# Patient Record
Sex: Male | Born: 1990 | Race: Black or African American | Hispanic: No | Marital: Single | Smoking: Current every day smoker
Health system: Southern US, Community
[De-identification: ages and names within clinical notes are randomized; demographics above are authoritative.]

## PROBLEM LIST (undated history)

## (undated) DIAGNOSIS — J069 Acute upper respiratory infection, unspecified: Secondary | ICD-10-CM

---
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
## 2013-04-02 ENCOUNTER — Encounter (HOSPITAL_BASED_OUTPATIENT_CLINIC_OR_DEPARTMENT_OTHER): Payer: Self-pay | Admitting: *Deleted

## 2013-04-02 ENCOUNTER — Emergency Department (HOSPITAL_BASED_OUTPATIENT_CLINIC_OR_DEPARTMENT_OTHER): Payer: Managed Care, Other (non HMO)

## 2013-04-02 ENCOUNTER — Emergency Department (HOSPITAL_BASED_OUTPATIENT_CLINIC_OR_DEPARTMENT_OTHER)
Admission: EM | Admit: 2013-04-02 | Discharge: 2013-04-02 | Disposition: A | Discharge status: Home / Self Care | Payer: Managed Care, Other (non HMO) | Attending: Emergency Medicine | Admitting: Emergency Medicine

## 2013-04-02 DIAGNOSIS — J069 Acute upper respiratory infection, unspecified: Secondary | ICD-10-CM | POA: Insufficient documentation

## 2013-04-02 DIAGNOSIS — R0982 Postnasal drip: Secondary | ICD-10-CM | POA: Insufficient documentation

## 2013-04-02 DIAGNOSIS — IMO0001 Reserved for inherently not codable concepts without codable children: Secondary | ICD-10-CM | POA: Insufficient documentation

## 2013-04-02 DIAGNOSIS — J3489 Other specified disorders of nose and nasal sinuses: Secondary | ICD-10-CM | POA: Insufficient documentation

## 2013-04-02 DIAGNOSIS — F17200 Nicotine dependence, unspecified, uncomplicated: Secondary | ICD-10-CM | POA: Insufficient documentation

## 2013-04-02 DIAGNOSIS — Z79899 Other long term (current) drug therapy: Secondary | ICD-10-CM | POA: Insufficient documentation

## 2013-04-02 DIAGNOSIS — R062 Wheezing: Secondary | ICD-10-CM | POA: Insufficient documentation

## 2013-04-02 MED ORDER — ALBUTEROL SULFATE HFA 108 (90 BASE) MCG/ACT IN AERS
2.0000 | INHALATION_SPRAY | Freq: Four times a day (QID) | RESPIRATORY_TRACT | Status: DC | PRN
  Administered 2013-04-02: 2 via RESPIRATORY_TRACT
  Filled 2013-04-02: qty 6.7

## 2013-04-02 MED ORDER — GUAIFENESIN-CODEINE 100-10 MG/5ML PO SYRP: 5.0000 mL | ORAL_SOLUTION | Freq: Three times a day (TID) | ORAL | Status: DC | PRN

## 2013-04-02 NOTE — Patient Instructions (Signed)
Instructed patient on the proper use of administering albuteral mdi via aerochamber patient tolerated well 

## 2013-04-02 NOTE — ED Provider Notes (Signed)
History     CSN: [id number]  Arrival date & time 04/02/13  1943   First MD Initiated Contact with Patient 04/02/13 2002      Chief Complaint  Patient presents with  . Cough    (Consider location/radiation/quality/duration/timing/severity/associated sxs/prior treatment) HPI Comments: Patient presents with a cough for the last 4 days. He states it started 4 days ago with runny nose nasal congestion and now she did move down into his chest. He's had a cough productive of white-yellow sputum. He denies any chest pain. He denies any shortness of breath. He had one episode of vomiting with it first started but denies any ongoing nausea vomiting or diarrhea. He denies a history of underlying lung disease. He does smoke cigarettes. He's been using Robitussin without relief  Patient is a 22 y.o. male presenting with cough.  Cough Associated symptoms: myalgias and rhinorrhea   Associated symptoms: no chest pain, no chills, no diaphoresis, no fever, no headaches, no rash and no shortness of breath     History reviewed. No pertinent past medical history.  History reviewed. No pertinent past surgical history.  History reviewed. No pertinent family history.  History  Substance Use Topics  . Smoking status: Current Every Day Smoker -- 0.50 packs/day    Types: Cigarettes  . Smokeless tobacco: Not on file  . Alcohol Use: No      Review of Systems  Constitutional: Negative for fever, chills, diaphoresis and fatigue.  HENT: Positive for congestion, rhinorrhea and postnasal drip. Negative for sneezing.   Eyes: Negative.   Respiratory: Positive for cough. Negative for chest tightness and shortness of breath.   Cardiovascular: Negative for chest pain and leg swelling.  Gastrointestinal: Negative for nausea, vomiting, abdominal pain, diarrhea and blood in stool.  Genitourinary: Negative for frequency, hematuria, flank pain and difficulty urinating.  Musculoskeletal: Positive for myalgias.  Negative for back pain and arthralgias.  Skin: Negative for rash.  Neurological: Negative for dizziness, speech difficulty, weakness, numbness and headaches.    Allergies  Review of patient's allergies indicates no known allergies.  Home Medications   Current Outpatient Rx  Name  Route  Sig  Dispense  Refill  . guaifenesin (ROBITUSSIN) 100 MG/5ML syrup   Oral   Take 200 mg by mouth 3 (three) times daily as needed for cough.         Marland Kitchen guaiFENesin-codeine (ROBITUSSIN AC) 100-10 MG/5ML syrup   Oral   Take 5 mLs by mouth 3 (three) times daily as needed for cough.   120 mL   0     BP 119/60  Pulse 63  Temp(Src) 98.9 F (37.2 C) (Oral)  Resp 16  Ht 5\' 6"  (1.676 m)  Wt 140 lb (63.504 kg)  BMI 22.61 kg/m2  SpO2 100%  Physical Exam  Constitutional: He is oriented to person, place, and time. He appears well-developed and well-nourished.  HENT:  Head: Normocephalic and atraumatic.  Mouth/Throat: Oropharynx is clear and moist.  Eyes: Pupils are equal, round, and reactive to light.  Neck: Normal range of motion. Neck supple.  Cardiovascular: Normal rate, regular rhythm and normal heart sounds.   Pulmonary/Chest: Effort normal. No respiratory distress. He has wheezes (. Mild scant expiratory wheezing). He has no rales. He exhibits no tenderness.  Abdominal: Soft. Bowel sounds are normal. There is no tenderness. There is no rebound and no guarding.  Musculoskeletal: Normal range of motion. He exhibits no edema and no tenderness.  Lymphadenopathy:    He has no cervical  adenopathy.  Neurological: He is alert and oriented to person, place, and time.  Skin: Skin is warm and dry. No rash noted.  Psychiatric: He has a normal mood and affect.    ED Course  Procedures (including critical care time)  Dg Chest 2 View  04/02/2013   *RADIOLOGY REPORT*  Clinical Data: Cough, fever.  CHEST - 2 VIEW  Comparison: None.  Findings: Heart and mediastinal contours are within normal limits. No  focal opacities or effusions.  No acute bony abnormality.  IMPRESSION: Negative.   Original Report Authenticated By: Charlett Nose, M.D.      1. URI (upper respiratory infection)       MDM  Patient with URI symptoms. His lungs are clear without evidence of pneumonia. He has some scant expiratory wheezing but no increased work of breathing or retractions. Given the fact he is a smoker I will go ahead and use an albuterol MDI. I gave him prescription for Robitussin-AC. I gave him referral to followup at the family practice group here. Advised to return if his symptoms worsen.        Rolan Bucco, MD 04/02/13 2034

## 2013-04-02 NOTE — ED Notes (Signed)
Pt reports cough and body aches x 4 days

## 2014-10-12 IMAGING — CR DG CHEST 2V
2 series · 2 of 2 positions shown · non-contrast
Comparison: None.

CLINICAL DATA: Cough, fever.

CHEST - 2 VIEW

[w chest pa]
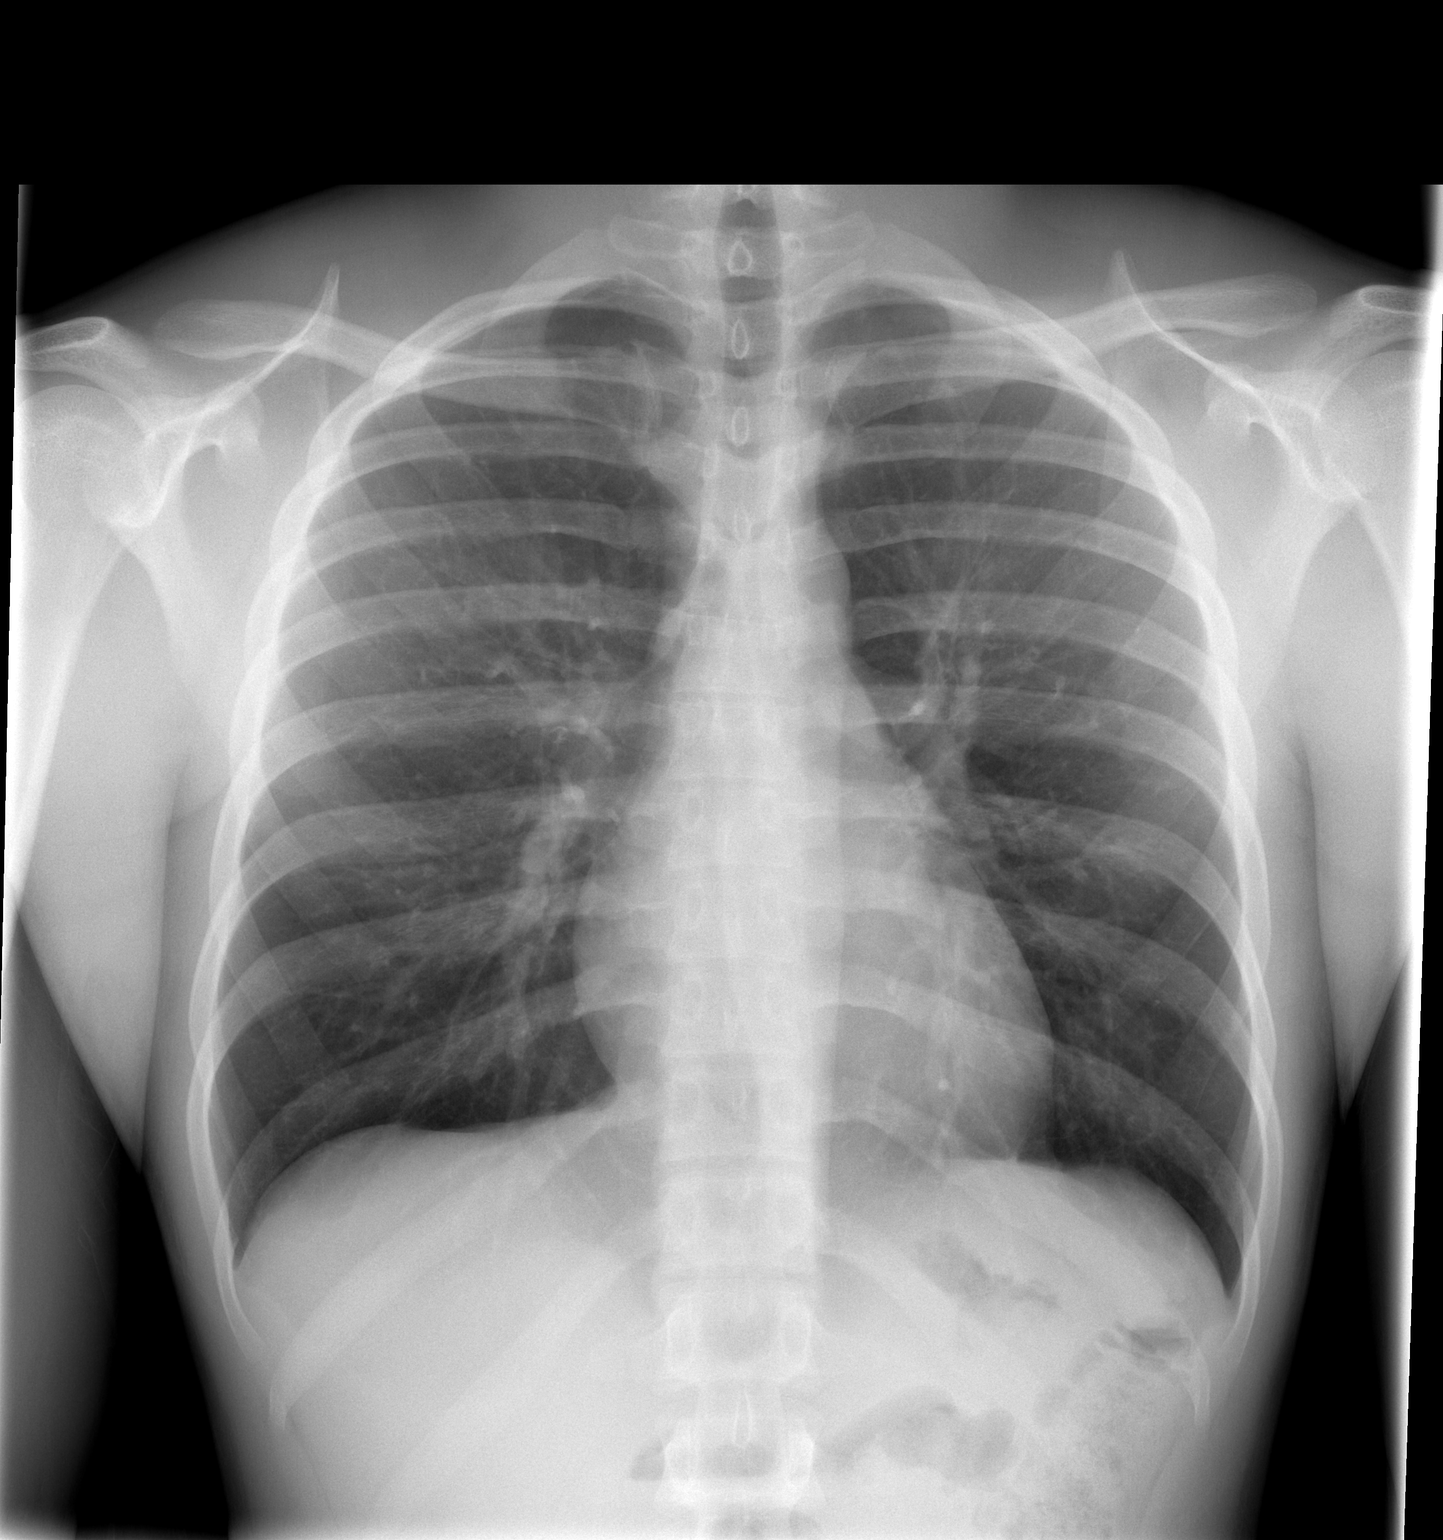

[w chest lat]
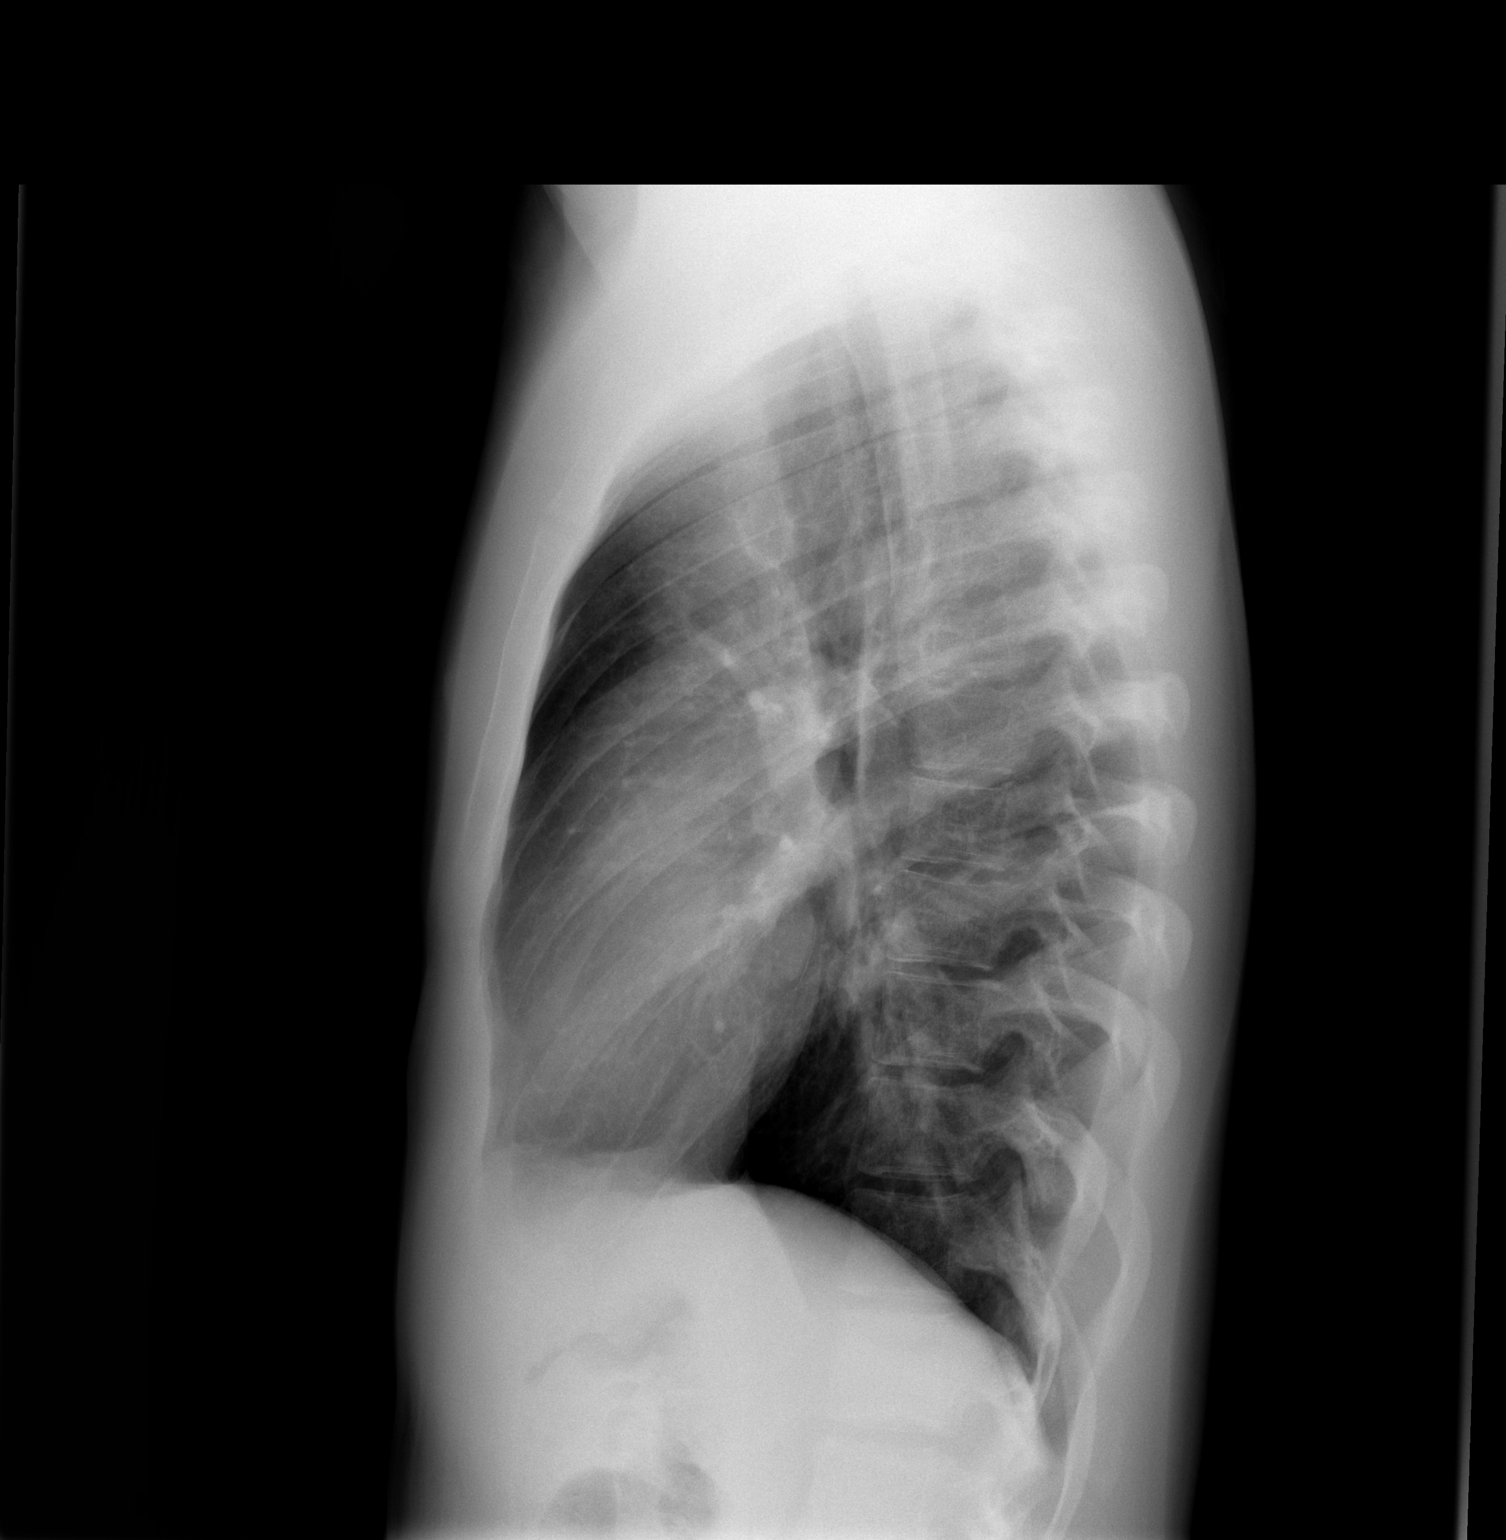

[2 of 2 positions shown; findings below may reference images not displayed]

FINDINGS: Heart and mediastinal contours are within normal limits.
No focal opacities or effusions.  No acute bony abnormality.
IMPRESSION: Negative.

## 2018-12-13 ENCOUNTER — Ambulatory Visit: Payer: Self-pay | Admitting: Surgery

## 2018-12-13 NOTE — H&P (Signed)
Bradley Norton Documented: 12/13/2018 3:47 PM Location: [facility] Patient #: [id number] DOB: [birth year] Single / Language: [place] / Race: Black or African American Male  History of Present Illness (Chelsea A. Fredricka Bonineonnor MD; 12/13/2018 4:09 PM) Patient words: Otherwise healthy 28 year old gentleman referred by his dermatologist for evaluation of a right inguinal lymph node. The patient does not know anything about this, he states that he has several skin issues including a lesion on his anterior right knee, and his right scrotal skin, and on his right medial buttock all of which are bothering him due to discomfort and worries about worsening infection.  The patient is a 28 year old male.   Past Surgical History Renee Ramus(Armen Ferguson, CMA; 12/13/2018 3:47 PM) No pertinent past surgical history  Diagnostic Studies History Renee Ramus(Armen Ferguson, CMA; 12/13/2018 3:47 PM) Colonoscopy never  Allergies Renee Ramus(Armen Ferguson, CMA; 12/13/2018 3:51 PM) No Known Allergies [12/13/2018]:  Medication History Renee Ramus(Armen Ferguson, CMA; 12/13/2018 3:52 PM) No Current Medications Medications Reconciled  Social History Renee Ramus(Armen Ferguson, CMA; 12/13/2018 3:47 PM) Alcohol use Moderate alcohol use. Illicit drug use Uses socially only. No caffeine use Tobacco use Current every day smoker.  Family History Renee Ramus(Armen Ferguson, CMA; 12/13/2018 3:47 PM) Alcohol Abuse Father. Arthritis Mother. Diabetes Mellitus Mother. Hypertension Father, Mother.  Other Problems Renee Ramus(Armen Ferguson, CMA; 12/13/2018 3:47 PM) No pertinent past medical history     Review of Systems (Armen Ferguson CMA; 12/13/2018 3:47 PM) General Not Present- Appetite Loss, Chills, Fatigue, Fever, Night Sweats, Weight Gain and Weight Loss. Skin Not Present- Change in Wart/Mole, Dryness, Hives, Jaundice, New Lesions, Non-Healing Wounds, Rash and Ulcer. HEENT Not Present- Earache, Hearing Loss, Hoarseness, Nose Bleed, Oral Ulcers, Ringing in the Ears,  Seasonal Allergies, Sinus Pain, Sore Throat, Visual Disturbances, Wears glasses/contact lenses and Yellow Eyes. Respiratory Not Present- Bloody sputum, Chronic Cough, Difficulty Breathing, Snoring and Wheezing. Breast Not Present- Breast Mass, Breast Pain, Nipple Discharge and Skin Changes. Gastrointestinal Not Present- Abdominal Pain, Bloating, Bloody Stool, Change in Bowel Habits, Chronic diarrhea, Constipation, Difficulty Swallowing, Excessive gas, Gets full quickly at meals, Hemorrhoids, Indigestion, Nausea, Rectal Pain and Vomiting. Male Genitourinary Not Present- Blood in Urine, Change in Urinary Stream, Frequency, Impotence, Nocturia, Painful Urination, Urgency and Urine Leakage. Musculoskeletal Not Present- Back Pain, Joint Pain, Joint Stiffness, Muscle Pain, Muscle Weakness and Swelling of Extremities. Neurological Not Present- Decreased Memory, Fainting, Headaches, Numbness, Seizures, Tingling, Tremor, Trouble walking and Weakness. Hematology Not Present- Blood Thinners, Easy Bruising, Excessive bleeding, Gland problems, HIV and Persistent Infections.  Vitals (Armen Ferguson CMA; 12/13/2018 3:50 PM) 12/13/2018 3:50 PM Weight: 132 lb Height: 66in Body Surface Area: 1.68 m Body Mass Index: 21.31 kg/m  Temp.: 98.34F  Pulse: 70 (Regular)  P.OX: 98% (Room air) BP: 110/80 (Sitting, Left Arm, Standard)      Physical Exam (Chelsea A. Fredricka Bonineonnor MD; 12/13/2018 4:11 PM)  The physical exam findings are as follows: Note:Gen: alert and well appearing Eye: extraocular motion intact, no scleral icterus ENT: moist mucus membranes, dentition intact Neck: no mass or thyromegaly Chest: unlabored respirations, symmetrical air entry, clear bilaterally CV: regular rate and rhythm, no pedal edema Abdomen: soft, nontender, nondistended. No mass or organomegaly MSK: strength symmetrical throughout, no deformity Neuro: grossly intact, normal gait Psych: normal mood and affect, appropriate  insight Skin: warm and dry. The lesion of concern to the patient on the right anterior knee is approximately 3 mm very superficial subcutaneous nodule likely residual scar/ inflammation. There is no palpable or visible skin lesion in the right groin  or scrotum and the patient is unable to locate the region had previously been worried about. In the right superior medial buttocks, there is an approximately 1 cm subcutaneous cyst without any overlying skin changes to suggest active infection  In the right groin there is a vaguely palpable superficial lymph node which is soft, mobile, and nontender, approximately 1 cm maximal diameter    Assessment & Plan (Chelsea A. Fredricka Bonine MD; 12/13/2018 4:14 PM)  PILONIDAL CYST (L05.91) Story: The lesion on his right knee does not require any intervention, he can follow up further with his dermatologist. The lymph node noted in the right groin is not especially concerning. Advised patient that if it increases in size significantly or if other LAD noted would recommend core biopsy. He does have a small subcuaneous cyst on the upper medial right buttock c/w pilonidal or sebaceous which is bothering him and he wishes excised. Discussed that this is outpatient procedure, risk of bleeding, infection, scarring, cyst recurrence, and high likelihood of open wound requiring packing changes by the patient daily for several weeks. He expresses understanding and wishes to proceed with excision.

## 2019-02-03 ENCOUNTER — Emergency Department (HOSPITAL_BASED_OUTPATIENT_CLINIC_OR_DEPARTMENT_OTHER)
Admission: EM | Admit: 2019-02-03 | Discharge: 2019-02-03 | Disposition: A | Discharge status: Home / Self Care | Payer: Managed Care, Other (non HMO) | Attending: Emergency Medicine | Admitting: Emergency Medicine

## 2019-02-03 ENCOUNTER — Encounter (HOSPITAL_BASED_OUTPATIENT_CLINIC_OR_DEPARTMENT_OTHER): Payer: Self-pay

## 2019-02-03 ENCOUNTER — Other Ambulatory Visit: Payer: Self-pay

## 2019-02-03 DIAGNOSIS — R599 Enlarged lymph nodes, unspecified: Secondary | ICD-10-CM

## 2019-02-03 DIAGNOSIS — L089 Local infection of the skin and subcutaneous tissue, unspecified: Secondary | ICD-10-CM | POA: Diagnosis not present

## 2019-02-03 DIAGNOSIS — F17210 Nicotine dependence, cigarettes, uncomplicated: Secondary | ICD-10-CM | POA: Insufficient documentation

## 2019-02-03 DIAGNOSIS — R222 Localized swelling, mass and lump, trunk: Secondary | ICD-10-CM | POA: Diagnosis present

## 2019-02-03 MED ORDER — CLINDAMYCIN HCL 150 MG PO CAPS: 300.0000 mg | ORAL_CAPSULE | Freq: Four times a day (QID) | ORAL | 0 refills | Status: DC

## 2019-02-03 NOTE — ED Provider Notes (Signed)
[facility] EMERGENCY DEPARTMENT Provider Note   CSN: [id number] Arrival date & time: 02/03/19  1610    History   Chief Complaint Chief Complaint  Patient presents with  . Swollen Lymph Node    HPI Bradley Norton is a 28 y.o. male.     Patient presents with 2-day history of tenderness in the left groin area with associated swelling.  Patient states that he has a history of "hair bumps" in the area and noted a swollen lymph node at onset.  No fevers, nausea, vomiting.  No urinary symptoms or penile discharge.  He thinks he has a small area of skin irritation.  He has been applying cold compresses with improvement in pain prior to arrival.     History reviewed. No pertinent past medical history.  There are no active problems to display for this patient.   History reviewed. No pertinent surgical history.      Home Medications    Prior to Admission medications   Medication Sig Start Date End Date Taking? Authorizing Provider  clindamycin (CLEOCIN) 150 MG capsule Take 2 capsules (300 mg total) by mouth every 6 (six) hours. 02/03/19   Renne Crigler, PA-C  guaifenesin (ROBITUSSIN) 100 MG/5ML syrup Take 200 mg by mouth 3 (three) times daily as needed for cough.    [provider]  guaiFENesin-codeine (ROBITUSSIN AC) 100-10 MG/5ML syrup Take 5 mLs by mouth 3 (three) times daily as needed for cough. 04/02/13   Rolan Bucco, MD    Family History No family history on file.  Social History Social History   Tobacco Use  . Smoking status: Current Every Day Smoker    Packs/day: 0.50    Types: Cigarettes  Substance Use Topics  . Alcohol use: No  . Drug use: Yes    Types: Marijuana     Allergies   Patient has no known allergies.   Review of Systems Review of Systems  Constitutional: Negative for fever.  Gastrointestinal: Negative for nausea and vomiting.  Skin: Positive for color change.  Hematological: Positive for adenopathy.     Physical  Exam Updated Vital Signs BP 120/80 (BP Location: Left Arm)   Pulse 87   Temp 98.2 F (36.8 C) (Oral)   Resp 12   Ht 5\' 8"  (1.727 m)   Wt 59 kg   SpO2 100%   BMI 19.77 kg/m   Physical Exam Vitals signs and nursing note reviewed.  Constitutional:      Appearance: He is well-developed.  HENT:     Head: Normocephalic and atraumatic.  Eyes:     Conjunctiva/sclera: Conjunctivae normal.  Neck:     Musculoskeletal: Normal range of motion and neck supple.  Pulmonary:     Effort: No respiratory distress.  Skin:    General: Skin is warm and dry.     Comments: There is a small area of irritation without fluctuance or induration medial to the left groin crease.  Within the groin crease there is a slightly enlarged lymph node without overlying warmth or erythema.  Patient also has several small normal nontender lymph nodes.  All are mobile.  Neurological:     Mental Status: He is alert.      ED Treatments / Results  Labs (all labs ordered are listed, but only abnormal results are displayed) Labs Reviewed - No data to display  EKG None  Radiology No results found.  Procedures Procedures (including critical care time)  Medications Ordered in ED Medications - No  data to display   Initial Impression / Assessment and Plan / ED Course  I have reviewed the triage vital signs and the nursing notes.  Pertinent labs & imaging results that were available during my care of the patient were reviewed by me and considered in my medical decision making (see chart for details).        Patient seen and examined.  Will give course of clindamycin.  Encouraged warm compresses.  Encouraged NSAIDs for pain control.  If the area enlarges or becomes more painful or if he develops systemic symptoms such as fever he should return for recheck.  He states that he plans to follow-up with his dermatologist.  Vital signs reviewed and are as follows: BP 120/80 (BP Location: Left Arm)   Pulse 87    Temp 98.2 F (36.8 C) (Oral)   Resp 12   Ht 5\' 8"  (1.727 m)   Wt 59 kg   SpO2 100%   BMI 19.77 kg/m     Final Clinical Impressions(s) / ED Diagnoses   Final diagnoses:  Skin infection  Enlarged lymph node   Patient with small area of skin irritation, mild infection likely closing secondary enlarged lymph node.  There is no abscess today which would require incision and drainage.  Treatment as above.  Return instructions as above.   ED Discharge Orders         Ordered    clindamycin (CLEOCIN) 150 MG capsule  Every 6 hours     02/03/19 1628           Renne Crigler, PA-C 02/03/19 1633    Rolan Bucco, MD 02/03/19 [phone]

## 2019-02-03 NOTE — Discharge Instructions (Signed)
Please read and follow all provided instructions.  Your diagnoses today include:  1. Skin infection   2. Enlarged lymph node     Tests performed today include:  Vital signs. See below for your results today.   Medications prescribed:   Clindamycin - antibiotic  You have been prescribed an antibiotic medicine: take the entire course of medicine even if you are feeling better. Stopping early can cause the antibiotic not to work.  Take any prescribed medications only as directed.   Home care instructions:   Follow any educational materials contained in this packet  Follow-up instructions: Return to the Emergency Department in 48 hours for a recheck if your symptoms are not significantly improved.  Return instructions:  Return to the Emergency Department if you have:  Fever  Worsening symptoms  Worsening pain  Worsening swelling  Redness of the skin that moves away from the affected area, especially if it streaks away from the affected area   Any other emergent concerns  Your vital signs today were: BP 120/80 (BP Location: Left Arm)    Pulse 87    Temp 98.2 F (36.8 C) (Oral)    Resp 12    Ht 5\' 8"  (1.727 m)    Wt 59 kg    SpO2 100%    BMI 19.77 kg/m  If your blood pressure (BP) was elevated above 135/85 this visit, please have this repeated by your doctor within one month. --------------

## 2019-02-03 NOTE — ED Triage Notes (Signed)
Pt c/o swollen lymph node since yesterday, denies recent illness, denies fevers, denies cough or URI

## 2019-02-04 ENCOUNTER — Telehealth (HOSPITAL_BASED_OUTPATIENT_CLINIC_OR_DEPARTMENT_OTHER): Payer: Self-pay | Admitting: Emergency Medicine

## 2019-07-31 ENCOUNTER — Ambulatory Visit: Payer: Self-pay | Admitting: Surgery

## 2019-07-31 NOTE — H&P (View-Only) (Signed)
[name] Documented: 07/31/2019 8:50 AM Location: [facility] Patient #: [id number] DOB: [birth year] Single / Language: Bradley Norton / Race: Black or African American Male  History of Present Illness (Bradley Junio A. Kae Heller MD; 07/31/2019 9:06 AM) Patient words: Otherwise healthy 28 year old gentleman who I met in January of this year and had planned excision of a pilonidal/gluteal cyst. 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 No other changes in his health.  The patient is a 28 year old male.   Allergies [name], [name]; 07/31/2019 8:51 AM) No Known Allergies [12/13/2018]: Allergies Reconciled  Medication History [name], [place]; 07/31/2019 8:51 AM) No Current Medications Medications Reconciled    Vitals [name] CMA; 07/31/2019 8:51 AM) 07/31/2019 8:50 AM Weight: 137.6 lb Height: 66in Body Surface Area: 1.71 m Body Mass Index: 22.21 kg/m  Temp.: 20F  Pulse: 86 (Regular)  BP: 112/74 (Sitting, Left Arm, Standard)        Physical Exam ([name] A. Kae Heller MD; 07/31/2019 9:07 AM)  The physical exam findings are as follows: Note:Alert and well-appearing. Extraocular motions intact, no scleral icterus Unlabored respirations. Skin is warm and dry, to the right of midline a few centimeters below the top of the anal cleft there is a vaguely palpable 1 cm subcutaneous cyst. No tenderness or fluctuance, no overlying skin changes.    Assessment & Plan ([name] A. Kae Heller MD; 07/31/2019 9:08 AM)  PILONIDAL CYST (L05.91) Story: Continues to complain of a small subcutaneous cyst on the upper medial right buttock c/w pilonidal or sebaceous which is increasingly bothering him and he wishes excised. Discussed that this is outpatient procedure, risk of bleeding,  infection, scarring, cyst recurrence, and high likelihood of open wound requiring packing changes by the patient daily for several weeks. He expresses understanding and wishes to proceed with excision. We will go ahead and try to schedule him this month

## 2019-07-31 NOTE — H&P (Signed)
Bradley Norton Documented: 07/31/2019 8:50 AM Location: [facility] Patient #: [id number] DOB: [birth year] Single / Language: Bradley Norton / Race: Black or African American Male  History of Present Illness (Bradley Bartley A. Kae Heller MD; 07/31/2019 9:06 AM) Patient words: Otherwise healthy 29 year old gentleman who I met in January of this year and had planned excision of a pilonidal/gluteal cyst. 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 No other changes in his health.  The patient is a 28 year old male.   Allergies Bradley Norton, [name]; 07/31/2019 8:51 AM) No Known Allergies [12/13/2018]: Allergies Reconciled  Medication History Bradley Norton, [name]; 07/31/2019 8:51 AM) No Current Medications Medications Reconciled    Vitals Bradley Norton CMA; 07/31/2019 8:51 AM) 07/31/2019 8:50 AM Weight: 137.6 lb Height: 66in Body Surface Area: 1.71 m Body Mass Index: 22.21 kg/m  Temp.: 32F  Pulse: 86 (Regular)  BP: 112/74 (Sitting, Left Arm, Standard)        Physical Exam (Bradley Norton A. Kae Heller MD; 07/31/2019 9:07 AM)  The physical exam findings are as follows: Note:Alert and well-appearing. Extraocular motions intact, no scleral icterus Unlabored respirations. Skin is warm and dry, to the right of midline a few centimeters below the top of the anal cleft there is a vaguely palpable 1 cm subcutaneous cyst. No tenderness or fluctuance, no overlying skin changes.    Assessment & Plan (Bradley Handy A. Kae Heller MD; 07/31/2019 9:08 AM)  PILONIDAL CYST (L05.91) Story: Continues to complain of a small subcutaneous cyst on the upper medial right buttock c/w pilonidal or sebaceous which is increasingly bothering him and he wishes excised. Discussed that this is outpatient procedure, risk of bleeding,  infection, scarring, cyst recurrence, and high likelihood of open wound requiring packing changes by the patient daily for several weeks. He expresses understanding and wishes to proceed with excision. We will go ahead and try to schedule him this month

## 2019-08-12 NOTE — Progress Notes (Signed)
[id number] - [place], [place] - [address] [place] [address] Phone: [phone] Fax: [phone]      Your procedure is scheduled on September 28th.  Report to [facility] Main Entrance "A" at 5:30 A.M., and check in at the Admitting office.  Call this number if you have problems the morning of surgery:  [phone]  Call [phone] if you have any questions prior to your surgery date Monday-Friday 8am-4pm    Remember:  Do not eat or drink after midnight the night before your surgery    Take these medicines the morning of surgery with A SIP OF WATER - NONE  7 days prior to surgery STOP taking any Aspirin (unless otherwise instructed by your surgeon), Aleve, Naproxen, Ibuprofen, Motrin, Advil, Goody's, BC's, all herbal medications, fish oil, and all vitamins.    The Morning of Surgery  Do not wear jewelry.  Do not wear lotions, powders, colognes, or deodorant  Men may shave face and neck.  Do not bring valuables to the hospital.  [facility] is not responsible for any belongings or valuables.  If you are a smoker, DO NOT Smoke 24 hours prior to surgery IF you wear a CPAP at night please bring your mask, tubing, and machine the morning of surgery   Remember that you must have someone to transport you home after your surgery, and remain with you for 24 hours if you are discharged the same day.   Contacts, glasses, hearing aids, dentures or bridgework may not be worn into surgery.    Leave your suitcase in the car.  After surgery it may be brought to your room.  For patients admitted to the hospital, discharge time will be determined by your treatment team.  Patients discharged the day of surgery will not be allowed to drive home.    Special instructions:   [facility]- Preparing For Surgery  Before surgery, you can play an important role. Because skin is not sterile, your skin  needs to be as free of germs as possible. You can reduce the number of germs on your skin by washing with CHG (chlorahexidine gluconate) Soap before surgery.  CHG is an antiseptic cleaner which kills germs and bonds with the skin to continue killing germs even after washing.    Oral Hygiene is also important to reduce your risk of infection.  Remember - BRUSH YOUR TEETH THE MORNING OF SURGERY WITH YOUR REGULAR TOOTHPASTE  Please do not use if you have an allergy to CHG or antibacterial soaps. If your skin becomes reddened/irritated stop using the CHG.  Do not shave (including legs and underarms) for at least 48 hours prior to first CHG shower. It is OK to shave your face.  Please follow these instructions carefully.   1. Shower the NIGHT BEFORE SURGERY and the MORNING OF SURGERY with CHG Soap.   2. If you chose to wash your hair, wash your hair first as usual with your normal shampoo.  3. After you shampoo, rinse your hair and body thoroughly to remove the shampoo.  4. Use CHG as you would any other liquid soap. You can apply CHG directly to the skin and wash gently with a scrungie or a clean washcloth.   5. Apply the CHG Soap to your body ONLY FROM THE NECK DOWN.  Do not use on open wounds or open sores. Avoid contact with your eyes,  ears, mouth and genitals (private parts). Wash Face and genitals (private parts)  with your normal soap.   6. Wash thoroughly, paying special attention to the area where your surgery will be performed.  7. Thoroughly rinse your body with warm water from the neck down.  8. DO NOT shower/wash with your normal soap after using and rinsing off the CHG Soap.  9. Pat yourself dry with a CLEAN TOWEL.  10. Wear CLEAN PAJAMAS to bed the night before surgery, wear comfortable clothes the morning of surgery  11. Place CLEAN SHEETS on your bed the night of your first shower and DO NOT SLEEP WITH PETS.    Day of Surgery:  Do not apply any  deodorants/lotions. Please shower the morning of surgery with the CHG soap  Please wear clean clothes to the hospital/surgery center.   Remember to brush your teeth WITH YOUR REGULAR TOOTHPASTE.   Please read over the following fact sheets that you were given.

## 2019-08-13 ENCOUNTER — Other Ambulatory Visit: Payer: Self-pay

## 2019-08-13 ENCOUNTER — Encounter (HOSPITAL_COMMUNITY): Payer: Self-pay

## 2019-08-13 ENCOUNTER — Encounter (HOSPITAL_COMMUNITY)
Admission: RE | Admit: 2019-08-13 | Discharge: 2019-08-13 | Disposition: A | Discharge status: Home / Self Care | Payer: Managed Care, Other (non HMO) | Source: Ambulatory Visit | Attending: Surgery | Admitting: Surgery

## 2019-08-13 DIAGNOSIS — Z01812 Encounter for preprocedural laboratory examination: Secondary | ICD-10-CM | POA: Diagnosis present

## 2019-08-13 HISTORY — DX: Acute upper respiratory infection, unspecified: J06.9

## 2019-08-13 LAB — CBC
HCT: 41.7 % (ref 39.0–52.0)
Hemoglobin: 14.3 g/dL (ref 13.0–17.0)
MCH: 32.2 pg (ref 26.0–34.0)
MCHC: 34.3 g/dL (ref 30.0–36.0)
MCV: 93.9 fL (ref 80.0–100.0)
Platelets: 192 10*3/uL (ref 150–400)
RBC: 4.44 MIL/uL (ref 4.22–5.81)
RDW: 12.8 % (ref 11.5–15.5)
WBC: 5.8 10*3/uL (ref 4.0–10.5)
nRBC: 0 % (ref 0.0–0.2)

## 2019-08-13 LAB — COMPREHENSIVE METABOLIC PANEL
ALT: 14 U/L (ref 0–44)
AST: 18 U/L (ref 15–41)
Albumin: 4.1 g/dL (ref 3.5–5.0)
Alkaline Phosphatase: 72 U/L (ref 38–126)
Anion gap: 8 (ref 5–15)
BUN: 9 mg/dL (ref 6–20)
CO2: 25 mmol/L (ref 22–32)
Calcium: 9.3 mg/dL (ref 8.9–10.3)
Chloride: 104 mmol/L (ref 98–111)
Creatinine, Ser: 1.01 mg/dL (ref 0.61–1.24)
GFR calc Af Amer: >60 mL/min (ref >60)
GFR calc non Af Amer: >60 mL/min (ref >60)
Glucose, Bld: 90 mg/dL (ref 70–99)
Potassium: 4.2 mmol/L (ref 3.5–5.1)
Sodium: 137 mmol/L (ref 135–145)
Total Bilirubin: 0.3 mg/dL (ref 0.3–1.2)
Total Protein: 6.4 g/dL — ABNORMAL LOW (ref 6.5–8.1)

## 2019-08-13 NOTE — Progress Notes (Signed)
PCP - na Cardiologist - na  PPM/ICD - na Device Orders - na Rep Notified  Chest x-ray - na EKG - na Stress Test - na ECHO - na Cardiac Cath - na  Sleep Study - na CPAP -   Fasting Blood Sugar - na Checks Blood Sugar _____ times a day  Blood Thinner Instructions: na Aspirin Instructions: na  ERAS Protcol -na PRE-SURGERY Ensure - na  COVID TEST- 9/24   Anesthesia review:   Patient denies shortness of breath, fever, cough and chest pain at PAT appointment   Patient verbalized understanding of instructions that were given to them at the PAT appointment. Patient was also instructed that they will need to review over the PAT instructions again at home before surgery.

## 2019-08-14 ENCOUNTER — Other Ambulatory Visit (HOSPITAL_COMMUNITY)
Admission: RE | Admit: 2019-08-14 | Discharge: 2019-08-14 | Disposition: A | Discharge status: Home / Self Care | Payer: Managed Care, Other (non HMO) | Source: Ambulatory Visit | Attending: Surgery | Admitting: Surgery

## 2019-08-14 DIAGNOSIS — Z01812 Encounter for preprocedural laboratory examination: Secondary | ICD-10-CM | POA: Insufficient documentation

## 2019-08-14 DIAGNOSIS — Z20828 Contact with and (suspected) exposure to other viral communicable diseases: Secondary | ICD-10-CM | POA: Insufficient documentation

## 2019-08-15 LAB — NOVEL CORONAVIRUS, NAA (HOSP ORDER, SEND-OUT TO REF LAB; TAT 18-24 HRS): SARS-CoV-2, NAA: NOT DETECTED

## 2019-08-17 NOTE — Anesthesia Preprocedure Evaluation (Addendum)
Anesthesia Evaluation  Patient identified by MRN, date of birth, ID band Patient awake    Reviewed: Allergy & Precautions, NPO status , Patient's Chart, lab work & pertinent test results  Airway Mallampati: I  TM Distance: >3 FB Neck ROM: Full    Dental no notable dental hx. (+) Teeth Intact, Dental Advisory Given   Pulmonary neg pulmonary ROS, Current Smoker and Patient abstained from smoking.,    Pulmonary exam normal breath sounds clear to auscultation       Cardiovascular negative cardio ROS Normal cardiovascular exam Rhythm:Regular Rate:Normal     Neuro/Psych negative neurological ROS  negative psych ROS   GI/Hepatic negative GI ROS, Neg liver ROS,   Endo/Other  negative endocrine ROS  Renal/GU negative Renal ROS  negative genitourinary   Musculoskeletal negative musculoskeletal ROS (+)   Abdominal   Peds  Hematology negative hematology ROS (+)   Anesthesia Other Findings   Reproductive/Obstetrics                           Anesthesia Physical Anesthesia Plan  ASA: II  Anesthesia Plan: MAC   Post-op Pain Management:    Induction: Intravenous  PONV Risk Score and Plan: 0 and Propofol infusion and Treatment may vary due to age or medical condition  Airway Management Planned: Natural Airway  Additional Equipment:   Intra-op Plan:   Post-operative Plan:   Informed Consent: I have reviewed the patients History and Physical, chart, labs and discussed the procedure including the risks, benefits and alternatives for the proposed anesthesia with the patient or authorized representative who has indicated his/her understanding and acceptance.     Dental advisory given  Plan Discussed with: CRNA  Anesthesia Plan Comments:         Anesthesia Quick Evaluation

## 2019-08-18 ENCOUNTER — Other Ambulatory Visit: Payer: Self-pay

## 2019-08-18 ENCOUNTER — Ambulatory Visit (HOSPITAL_COMMUNITY): Payer: Managed Care, Other (non HMO)

## 2019-08-18 ENCOUNTER — Ambulatory Visit (HOSPITAL_COMMUNITY)
Admission: RE | Admit: 2019-08-18 | Discharge: 2019-08-18 | Disposition: A | Discharge status: Home / Self Care | Payer: Managed Care, Other (non HMO) | Attending: Surgery | Admitting: Surgery

## 2019-08-18 ENCOUNTER — Encounter (HOSPITAL_COMMUNITY)
Admission: RE | Disposition: A | Discharge status: Home / Self Care | Payer: Self-pay | Source: Home / Self Care | Attending: Surgery

## 2019-08-18 ENCOUNTER — Encounter (HOSPITAL_COMMUNITY): Payer: Self-pay | Admitting: General Practice

## 2019-08-18 DIAGNOSIS — L728 Other follicular cysts of the skin and subcutaneous tissue: Secondary | ICD-10-CM | POA: Insufficient documentation

## 2019-08-18 DIAGNOSIS — F17200 Nicotine dependence, unspecified, uncomplicated: Secondary | ICD-10-CM | POA: Insufficient documentation

## 2019-08-18 HISTORY — PX: MASS EXCISION: SHX2000

## 2019-08-18 SURGERY — EXCISION MASS
Anesthesia: Monitor Anesthesia Care | Site: Buttocks

## 2019-08-18 MED ORDER — TRAMADOL HCL 50 MG PO TABS: 50.0000 mg | ORAL_TABLET | Freq: Four times a day (QID) | ORAL | 0 refills | Status: AC | PRN

## 2019-08-18 MED ORDER — LIDOCAINE HCL (CARDIAC) PF 100 MG/5ML IV SOSY
PREFILLED_SYRINGE | INTRAVENOUS | Status: DC | PRN
  Administered 2019-08-18: 40 mg via INTRAVENOUS

## 2019-08-18 MED ORDER — CEFAZOLIN SODIUM-DEXTROSE 2-4 GM/100ML-% IV SOLN
2.0000 g | INTRAVENOUS | Status: AC
  Administered 2019-08-18: 08:00:00 2 g via INTRAVENOUS
  Filled 2019-08-18: qty 100

## 2019-08-18 MED ORDER — 0.9 % SODIUM CHLORIDE (POUR BTL) OPTIME
TOPICAL | Status: DC | PRN
  Administered 2019-08-18: 08:00:00 1000 mL

## 2019-08-18 MED ORDER — ACETAMINOPHEN 500 MG PO TABS
1000.0000 mg | ORAL_TABLET | ORAL | Status: DC
  Filled 2019-08-18: qty 2

## 2019-08-18 MED ORDER — GABAPENTIN 300 MG PO CAPS
300.0000 mg | ORAL_CAPSULE | ORAL | Status: DC
  Filled 2019-08-18: qty 1

## 2019-08-18 MED ORDER — BUPIVACAINE HCL (PF) 0.25 % IJ SOLN
INTRAMUSCULAR | Status: AC
  Filled 2019-08-18: qty 30

## 2019-08-18 MED ORDER — CHLORHEXIDINE GLUCONATE 4 % EX LIQD: 60.0000 mL | Freq: Once | CUTANEOUS | Status: DC

## 2019-08-18 MED ORDER — KETAMINE HCL 10 MG/ML IJ SOLN
INTRAMUSCULAR | Status: DC | PRN
  Administered 2019-08-18: 20 mg via INTRAVENOUS
  Administered 2019-08-18: 10 mg via INTRAVENOUS

## 2019-08-18 MED ORDER — PROPOFOL 10 MG/ML IV BOLUS
INTRAVENOUS | Status: DC | PRN
  Administered 2019-08-18 (×3): 20 mg via INTRAVENOUS
  Administered 2019-08-18: 30 mg via INTRAVENOUS

## 2019-08-18 MED ORDER — MIDAZOLAM HCL 2 MG/2ML IJ SOLN
INTRAMUSCULAR | Status: AC
  Filled 2019-08-18: qty 2

## 2019-08-18 MED ORDER — PROPOFOL 10 MG/ML IV BOLUS
INTRAVENOUS | Status: AC
  Filled 2019-08-18: qty 20

## 2019-08-18 MED ORDER — BUPIVACAINE-EPINEPHRINE (PF) 0.5% -1:200000 IJ SOLN
INTRAMUSCULAR | Status: AC
  Filled 2019-08-18: qty 30

## 2019-08-18 MED ORDER — PROPOFOL 1000 MG/100ML IV EMUL
INTRAVENOUS | Status: AC
  Filled 2019-08-18: qty 100

## 2019-08-18 MED ORDER — LIDOCAINE 2% (20 MG/ML) 5 ML SYRINGE
INTRAMUSCULAR | Status: AC
  Filled 2019-08-18: qty 5

## 2019-08-18 MED ORDER — PROPOFOL 500 MG/50ML IV EMUL
INTRAVENOUS | Status: DC | PRN
  Administered 2019-08-18: 150 ug/kg/min via INTRAVENOUS

## 2019-08-18 MED ORDER — BUPIVACAINE-EPINEPHRINE 0.25% -1:200000 IJ SOLN
INTRAMUSCULAR | Status: DC | PRN
  Administered 2019-08-18: 17 mL

## 2019-08-18 MED ORDER — LACTATED RINGERS IV SOLN
INTRAVENOUS | Status: DC | PRN
  Administered 2019-08-18: 08:00:00 via INTRAVENOUS

## 2019-08-18 MED ORDER — MIDAZOLAM HCL 2 MG/2ML IJ SOLN
INTRAMUSCULAR | Status: DC | PRN
  Administered 2019-08-18: 2 mg via INTRAVENOUS

## 2019-08-18 MED ORDER — KETAMINE HCL 50 MG/5ML IJ SOSY
PREFILLED_SYRINGE | INTRAMUSCULAR | Status: AC
  Filled 2019-08-18: qty 5

## 2019-08-18 SURGICAL SUPPLY — 29 items
CANISTER SUCT 3000ML PPV (MISCELLANEOUS) IMPLANT
COVER SURGICAL LIGHT HANDLE (MISCELLANEOUS) ×3 IMPLANT
COVER WAND RF STERILE (DRAPES) ×3 IMPLANT
DECANTER SPIKE VIAL GLASS SM (MISCELLANEOUS) ×3 IMPLANT
DERMABOND ADVANCED (GAUZE/BANDAGES/DRESSINGS) ×2
DERMABOND ADVANCED .7 DNX12 (GAUZE/BANDAGES/DRESSINGS) ×1 IMPLANT
DRAPE LAPAROSCOPIC ABDOMINAL (DRAPES) IMPLANT
DRAPE LAPAROTOMY 100X72 PEDS (DRAPES) ×3 IMPLANT
ELECT REM PT RETURN 9FT ADLT (ELECTROSURGICAL) ×3
ELECTRODE REM PT RTRN 9FT ADLT (ELECTROSURGICAL) ×1 IMPLANT
GAUZE SPONGE 4X4 12PLY STRL (GAUZE/BANDAGES/DRESSINGS) IMPLANT
GLOVE BIO SURGEON STRL SZ 6 (GLOVE) ×3 IMPLANT
GLOVE INDICATOR 6.5 STRL GRN (GLOVE) ×3 IMPLANT
GOWN STRL REUS W/ TWL LRG LVL3 (GOWN DISPOSABLE) ×1 IMPLANT
GOWN STRL REUS W/TWL LRG LVL3 (GOWN DISPOSABLE) ×2
KIT BASIN OR (CUSTOM PROCEDURE TRAY) ×3 IMPLANT
KIT TURNOVER KIT B (KITS) ×3 IMPLANT
NEEDLE HYPO 25GX1X1/2 BEV (NEEDLE) ×3 IMPLANT
NS IRRIG 1000ML POUR BTL (IV SOLUTION) ×3 IMPLANT
PACK GENERAL/GYN (CUSTOM PROCEDURE TRAY) ×3 IMPLANT
PAD ARMBOARD 7.5X6 YLW CONV (MISCELLANEOUS) ×3 IMPLANT
PENCIL SMOKE EVACUATOR (MISCELLANEOUS) ×3 IMPLANT
SPECIMEN JAR MEDIUM (MISCELLANEOUS) ×3 IMPLANT
SUT MNCRL AB 4-0 PS2 18 (SUTURE) ×3 IMPLANT
SUT VIC AB 3-0 SH 27 (SUTURE) ×2
SUT VIC AB 3-0 SH 27XBRD (SUTURE) ×1 IMPLANT
SYR CONTROL 10ML LL (SYRINGE) ×3 IMPLANT
TOWEL GREEN STERILE (TOWEL DISPOSABLE) ×3 IMPLANT
TOWEL GREEN STERILE FF (TOWEL DISPOSABLE) ×3 IMPLANT

## 2019-08-18 NOTE — Discharge Instructions (Signed)
GENERAL SURGERY: POST OP INSTRUCTIONS  ######################################################################  EAT Gradually transition to a high fiber diet with a fiber supplement over the next few weeks after discharge.  Start with a pureed / full liquid diet (see below)  WALK Walk an hour a day.  Control your pain to do that.    CONTROL PAIN Control pain so that you can walk, sleep, tolerate sneezing/coughing, go up/down stairs.  HAVE A BOWEL MOVEMENT DAILY Keep your bowels regular to avoid problems.  OK to try a laxative to override constipation.  OK to use an antidairrheal to slow down diarrhea.  Call if not better after 2 tries  CALL IF YOU HAVE PROBLEMS/CONCERNS Call if you are still struggling despite following these instructions. Call if you have concerns not answered by these instructions  ######################################################################    1. DIET: Follow a light bland diet & liquids the first 24 hours after arrival home, such as soup, liquids, starches, etc.  Be sure to drink plenty of fluids.  Quickly advance to a usual solid diet within a few days.  Avoid fast food or heavy meals as your are more likely to get nauseated or have irregular bowels.  A low-fat, high-fiber diet for the rest of your life is ideal.   2. Take your usually prescribed home medications unless otherwise directed. 3. PAIN CONTROL: a. Pain is best controlled by a usual combination of three different methods TOGETHER: i. Ice/Heat ii. Over the counter pain medication iii. Prescription pain medication b. Most patients will experience some swelling and bruising around the incisions.  Ice packs or heating pads (30-60 minutes up to 6 times a day) will help. Use ice for the first few days to help decrease swelling and bruising, then switch to heat to help relax tight/sore spots and speed recovery.  Some people prefer to use ice alone, heat alone, alternating between ice & heat.   Experiment to what works for you.  Swelling and bruising can take several weeks to resolve.   c. It is helpful to take an over-the-counter pain medication regularly for the first few weeks.  Choose one of the following that works best for you: i. Naproxen (Aleve, etc)  Two 220mg  tabs twice a day OR Ibuprofen (Advil, etc) Three 200mg  tabs four times a day (every meal & bedtime)  AND ii. Acetaminophen (Tylenol, etc) 500-650mg  four times a day (every meal & bedtime) d. A  prescription for pain medication should be given to you upon discharge.  Take your pain medication as prescribed, if needed.  i. If you are having problems/concerns with the prescription medicine (does not control pain, nausea, vomiting, rash, itching, etc), please call [phone] to see if we need to switch you to a different pain medicine that will work better for you and/or control your side effect better. ii. If you need a refill on your pain medication, please contact your pharmacy.  They will contact our office to request authorization. Prescriptions will not be filled after 5 pm or on week-ends. 4. Avoid getting constipated.  Between the surgery and the pain medications, it is common to experience some constipation.  Increasing fluid intake and taking a fiber supplement (such as Metamucil, Citrucel, FiberCon, MiraLax, etc) 1-2 times a day regularly will usually help prevent this problem from occurring.  A mild laxative (prune juice, Milk of Magnesia, MiraLax, etc) should be taken according to package directions if there are no bowel movements after 48 hours.   5. Wash / shower  every day, starting 2 days after surgery.  No rubbing, scrubbing, lotions or ointments to incision. 6. Remove your outer bandage 2 days after surgery.  You may leave the incision open to air.  You may have skin tapes (Steri Strips) covering the incision(s).  Leave them on until one week, then remove.  You may replace a dressing/Band-Aid to cover the  incision for comfort if you wish.   7. ACTIVITIES as tolerated:   a. You may resume regular (light) daily activities beginning the next day--such as daily self-care, walking, climbing stairs--gradually increasing activities as tolerated.  If you can walk 30 minutes without difficulty, it is safe to try more intense activity such as jogging, treadmill, bicycling, low-impact aerobics, swimming, etc. b. Save the most intensive and strenuous activity for last such as sit-ups, heavy lifting, contact sports, etc  Refrain from any heavy lifting or straining until you are off narcotics for pain control.   c. DO NOT PUSH THROUGH PAIN.  Let pain be your guide: If it hurts to do something, don't do it.  Pain is your body warning you to avoid that activity for another week until the pain goes down. d. You may drive when you are no longer taking prescription pain medication, you can comfortably wear a seatbelt, and you can safely maneuver your car and apply brakes. e. Dennis Bast may have sexual intercourse when it is comfortable.  8. FOLLOW UP in our office a. Please call [facility] at [phone] to set up an appointment to see your surgeon in the office for a follow-up appointment approximately 2-3 weeks after your surgery. b. Make sure that you call for this appointment the day you arrive home to insure a convenient appointment time. 9. IF YOU HAVE DISABILITY OR FAMILY LEAVE FORMS, BRING THEM TO THE OFFICE FOR PROCESSING.  DO NOT GIVE THEM TO YOUR DOCTOR.   WHEN TO CALL us [phone]: 1. Poor pain control 2. Reactions / problems with new medications (rash/itching, nausea, etc)  3. Fever over 101.5 F (38.5 C) 4. Worsening swelling or bruising 5. Continued bleeding from incision. 6. Increased pain, redness, or drainage from the incision 7. Difficulty breathing / swallowing   The clinic staff is available to answer your questions during regular business hours (8:30am-5pm).  Please dont hesitate to call and ask  to speak to one of our nurses for clinical concerns.   If you have a medical emergency, go to the nearest emergency room or call 911.  A surgeon from [facility] is always on call at the [facility], [place], [place], [place], [place]  [id number] ? MAIN: [phone] ? TOLL FREE: [phone] ?  FAX [phone] [email or web address]

## 2019-08-18 NOTE — Anesthesia Postprocedure Evaluation (Signed)
Anesthesia Post Note  Patient: Bradley Norton  Procedure(s) Performed: EXCISION OF SUBCUTANEOUS CYST (N/A Buttocks)     Patient location during evaluation: PACU Anesthesia Type: MAC Level of consciousness: awake and alert Pain management: pain level controlled Vital Signs Assessment: post-procedure vital signs reviewed and stable Respiratory status: spontaneous breathing, nonlabored ventilation, respiratory function stable and patient connected to nasal cannula oxygen Cardiovascular status: stable and blood pressure returned to baseline Postop Assessment: no apparent nausea or vomiting Anesthetic complications: no    Last Vitals:  Vitals:   08/18/19 0816 08/18/19 0818  BP:    Pulse:  82  Resp:  (!) 21  Temp: 36.4 C   SpO2:  99%    Last Pain:  Vitals:   08/18/19 0816  PainSc: 0-No pain                 [name] L Lorrie Strauch

## 2019-08-18 NOTE — Transfer of Care (Signed)
Immediate Anesthesia Transfer of Care Note  Patient: Bradley Norton  Procedure(s) Performed: EXCISION OF SUBCUTANEOUS CYST (N/A Buttocks)  Patient Location: PACU  Anesthesia Type:MAC  Level of Consciousness: drowsy  Airway & Oxygen Therapy: Patient Spontanous Breathing and Patient connected to nasal cannula oxygen  Post-op Assessment: Report given to RN and Post -op Vital signs reviewed and stable  Post vital signs: Reviewed and stable  Last Vitals:  Vitals Value Taken Time  BP 101/67 08/18/19 0815  Temp    Pulse 85 08/18/19 0817  Resp 27 08/18/19 0817  SpO2 100 % 08/18/19 0817  Vitals shown include unvalidated device data.  Last Pain:  Vitals:   08/18/19 0636  PainSc: 0-No pain      Patients Stated Pain Goal: 8 (67/12/45 8099)  Complications: No apparent anesthesia complications

## 2019-08-18 NOTE — Progress Notes (Signed)
Dr. Kae Heller aware that patient refused Tylenol and Gabapentin.

## 2019-08-18 NOTE — Op Note (Signed)
Operative Note  Bradley Norton  [id number]  [id number]  08/18/2019   Surgeon: Vikki Ports A ConnorMD  Assistant: OR staff  Procedure performed: excision of 1cm subcutaneous cyst, right superior natal cleft  Preop diagnosis: subcutaneous cyst Post-op diagnosis/intraop findings: same  Specimens: cyst Retained items: no EBL: minimal cc Complications: none  Description of procedure: After obtaining informed consent the patient was taken to the operating room and placed prone on operating room table [facility] was initiated, preoperative antibiotics were administered, SCDs applied, and a formal timeout was performed. The buttocks were gently taped apart and the region around the superior natal cleft was clipped, prepped and draped in the usual sterile fashion.  After infiltration with local, an elliptical incision was made over the region of the cyst.  Cautery was used to dissect through the dermis and subcutaneous tissues, excising the entire cyst wall.  The wound was irrigated and hemostasis ensured with cautery.  The incision was then closed with interrupted deep dermal 3-0 Vicryl and running subcuticular Monocryl.  Dermabond was applied. The patient was then awakened, returned to the supine position and taken to PACU in stable condition.   All counts were correct at the completion of the case.

## 2019-08-18 NOTE — Interval H&P Note (Signed)
History and Physical Interval Note:  08/18/2019 7:09 AM  Bradley Norton  has presented today for surgery, with the diagnosis of cyst.  The various methods of treatment have been discussed with the patient and family. After consideration of risks, benefits and other options for treatment, the patient has consented to  Procedure(s): EXCISION OF SUBCUTANEOUS GLUTEAL CYST (N/A) as a surgical intervention.  The patient's history has been reviewed, patient examined, no change in status, stable for surgery.  I have reviewed the patient's chart and labs.  Questions were answered to the patient's satisfaction.     Lanah Steines Rich Brave

## 2019-08-19 ENCOUNTER — Encounter (HOSPITAL_COMMUNITY): Payer: Self-pay | Admitting: Surgery

## 2019-08-19 LAB — SURGICAL PATHOLOGY

## 2020-08-10 ENCOUNTER — Emergency Department (HOSPITAL_BASED_OUTPATIENT_CLINIC_OR_DEPARTMENT_OTHER)
Admission: EM | Admit: 2020-08-10 | Discharge: 2020-08-10 | Disposition: A | Discharge status: Home / Self Care | Payer: Managed Care, Other (non HMO)

## 2020-08-10 ENCOUNTER — Other Ambulatory Visit: Payer: Self-pay
